# Patient Record
Sex: Male | Born: 1955 | Race: White | Hispanic: No | Smoking: Current every day smoker
Health system: Southern US, Community
[De-identification: ages and names within clinical notes are randomized; demographics above are authoritative.]

## PROBLEM LIST (undated history)

## (undated) DIAGNOSIS — F419 Anxiety disorder, unspecified: Secondary | ICD-10-CM

---

## 2004-01-02 ENCOUNTER — Emergency Department (HOSPITAL_COMMUNITY): Admission: EM | Admit: 2004-01-02 | Discharge: 2004-01-02 | Payer: Self-pay | Admitting: Emergency Medicine

## 2004-01-18 ENCOUNTER — Emergency Department (HOSPITAL_COMMUNITY): Admission: EM | Admit: 2004-01-18 | Discharge: 2004-01-18 | Payer: Self-pay | Admitting: *Deleted

## 2005-12-19 IMAGING — CR DG ANKLE COMPLETE 3+V*L*
3 series · 3 of 3 positions shown · non-contrast
Comparison: none

CLINICAL DATA: Twisted ankle ? lateral and dorsal pain.
 LEFT ANKLE, THREE VIEWS
 Three-view exam shows marked soft tissue swelling over the lateral malleolus.  There is a probable small avulsion off of the medial aspect of the distal fibula.  There is an accessory ossicle of the medial malleolus and a prominent calcaneal spur.
 IMPRESSION 
 Lateral soft tissue swelling with probable small avulsion off the fibula.

[view not recorded (1 of 3)]
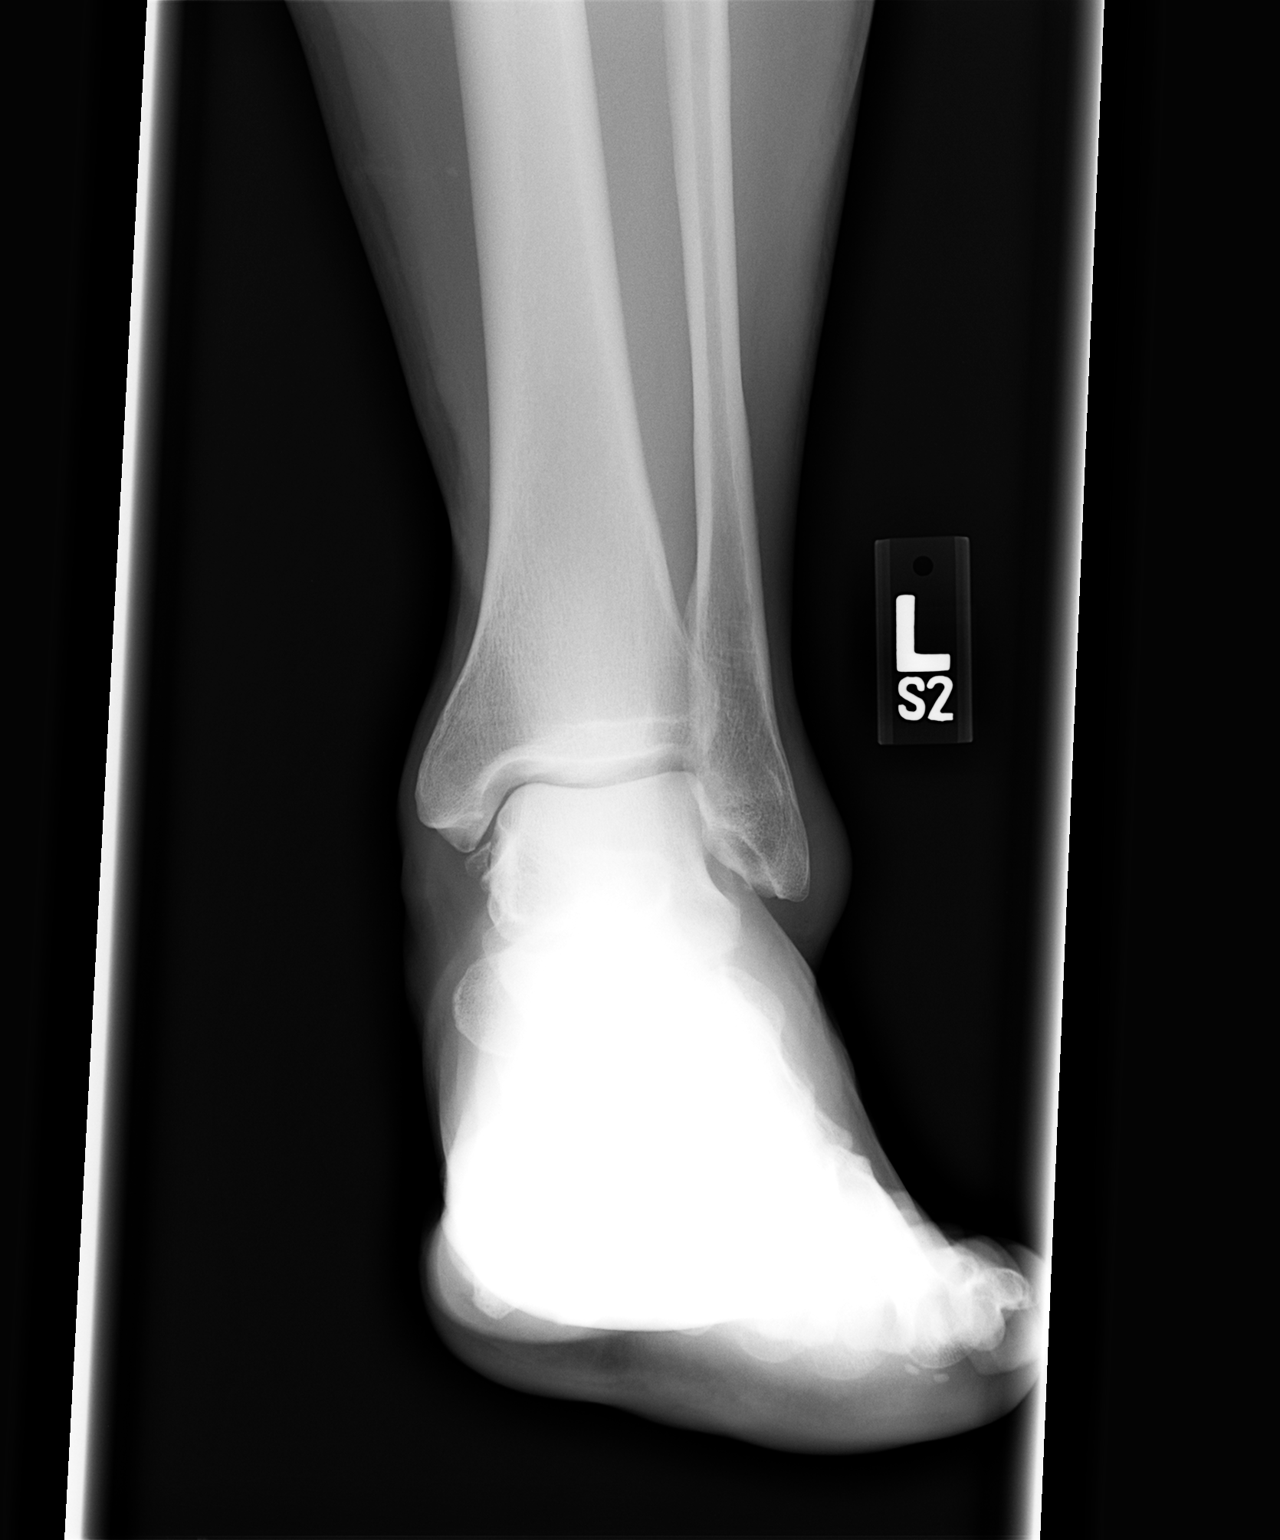

[view not recorded (2 of 3)]
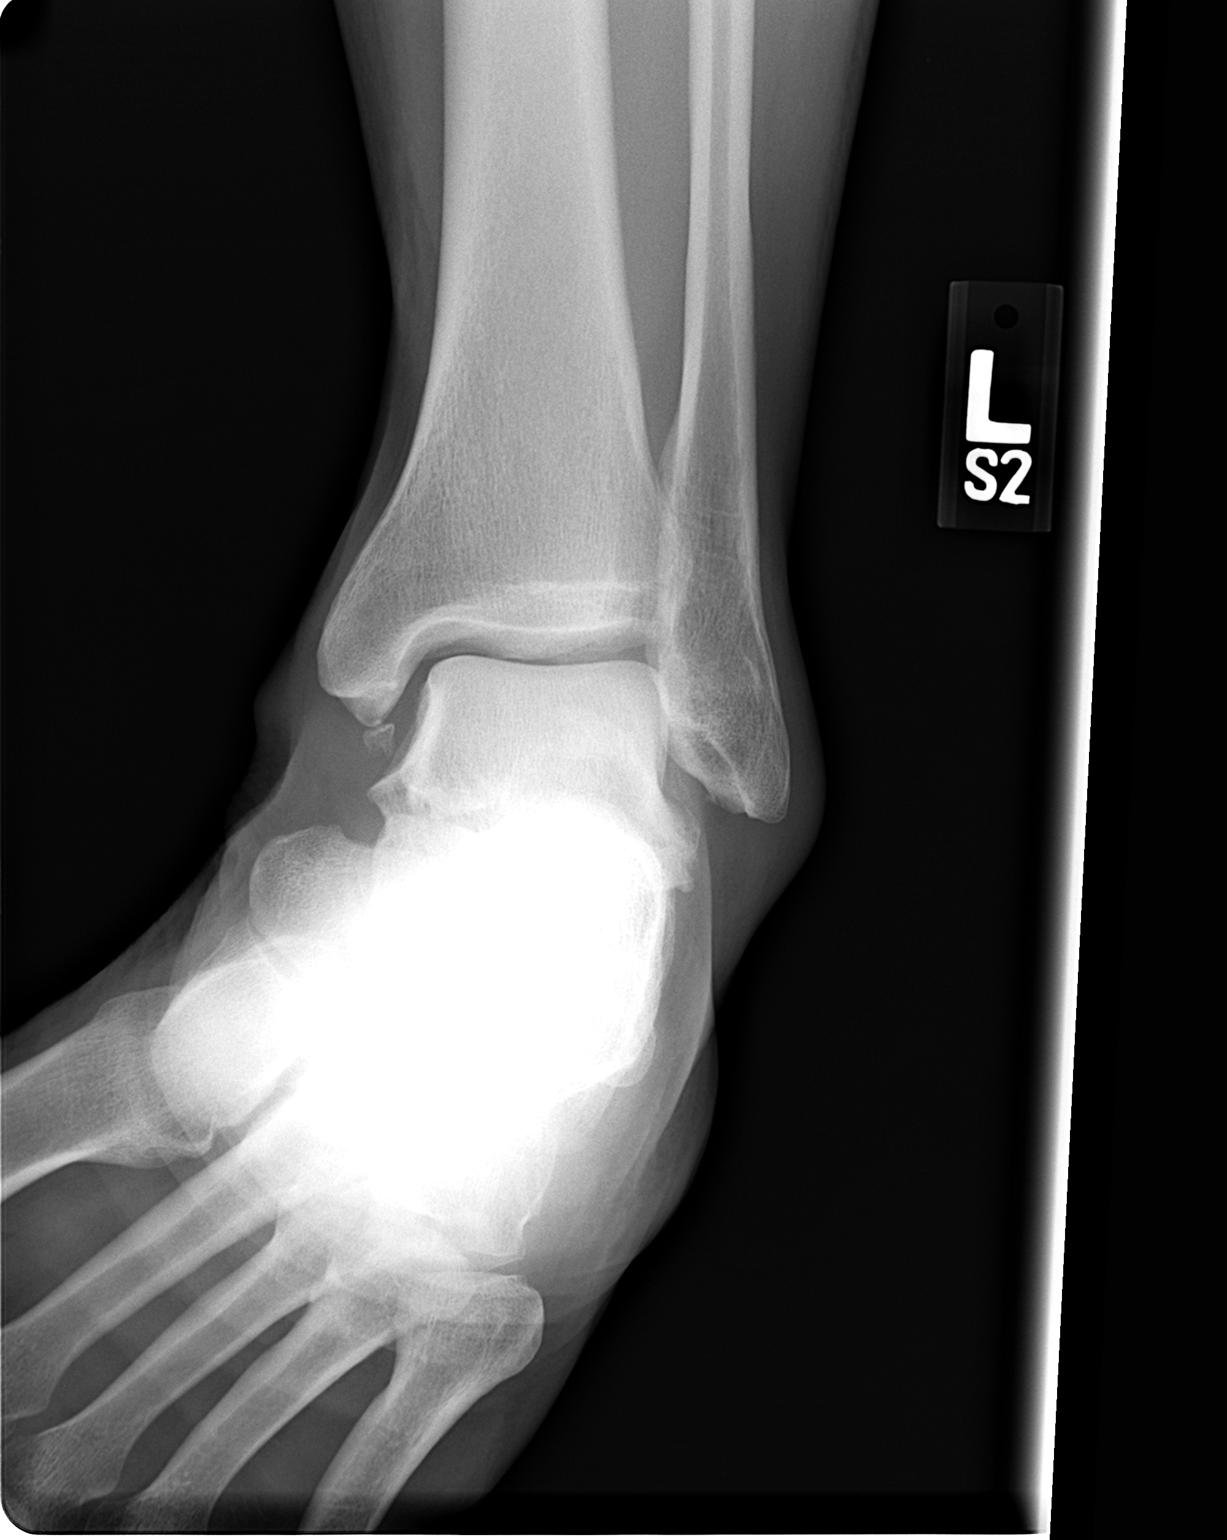

[view not recorded (3 of 3)]
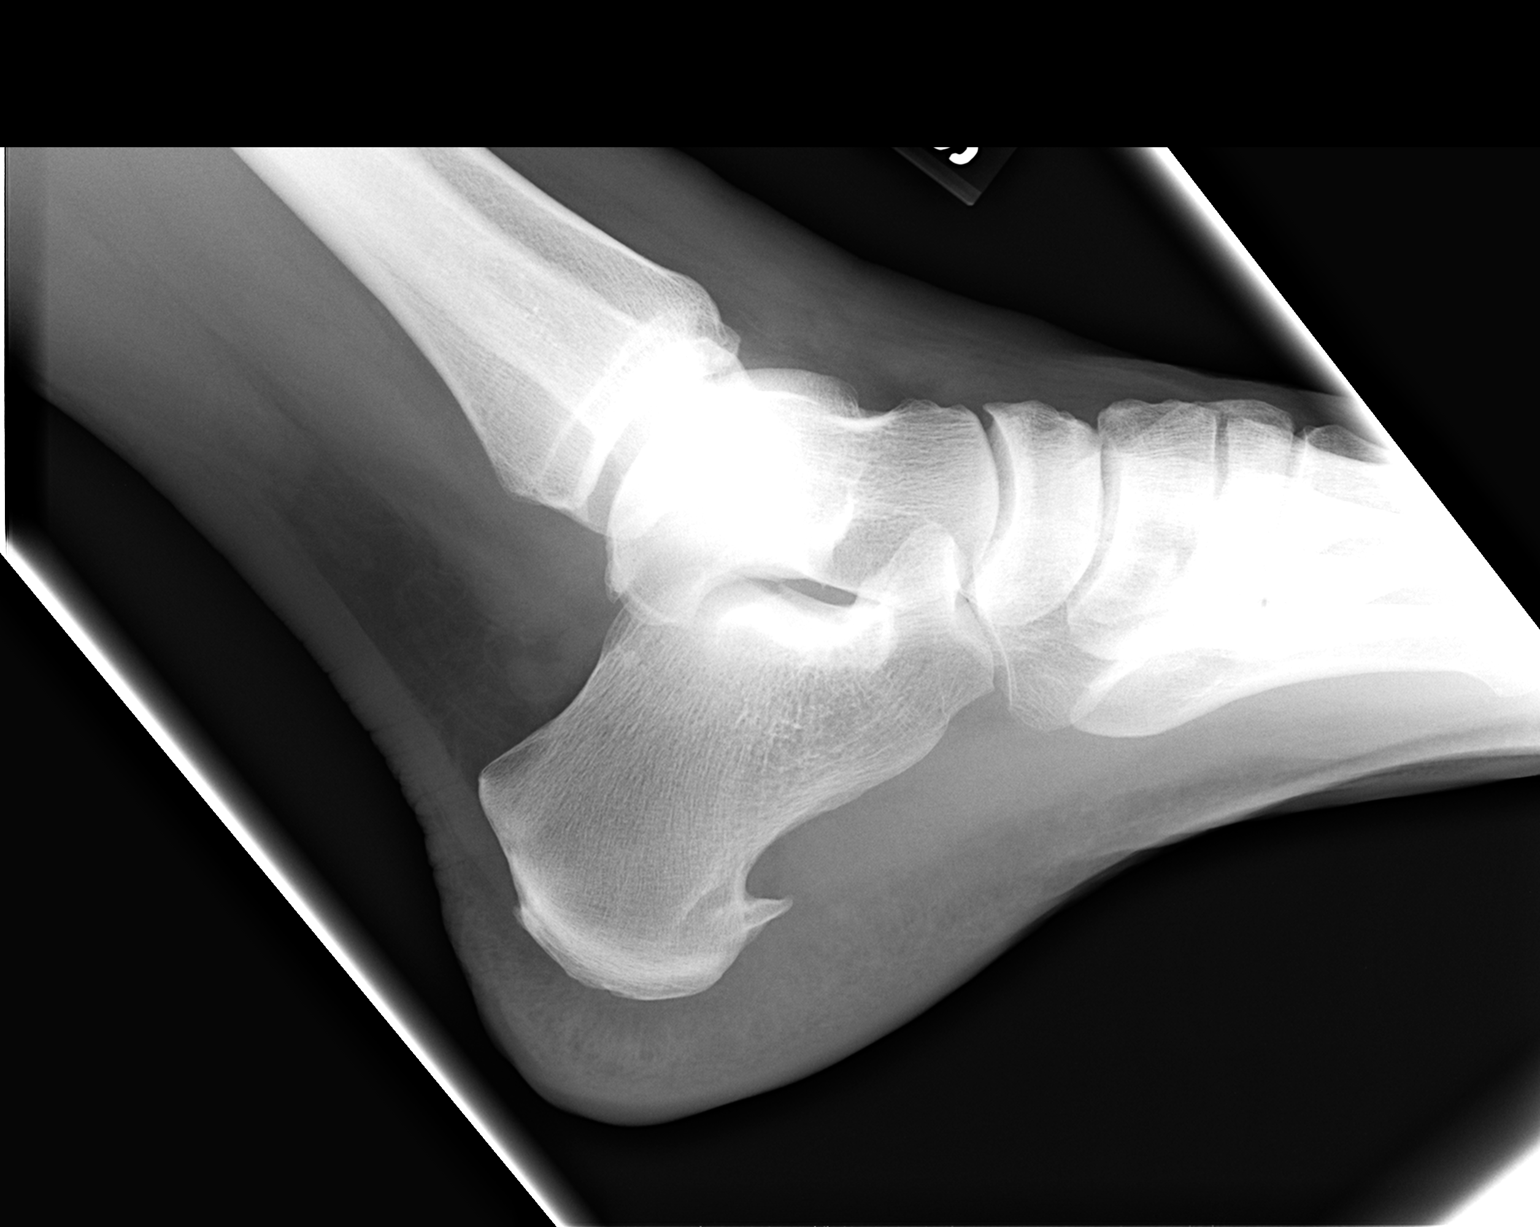

[3 of 3 positions shown; findings below may reference images not displayed]

## 2014-02-04 ENCOUNTER — Encounter (HOSPITAL_COMMUNITY): Payer: Self-pay | Admitting: *Deleted

## 2014-02-04 ENCOUNTER — Emergency Department (HOSPITAL_COMMUNITY)
Admission: EM | Admit: 2014-02-04 | Discharge: 2014-02-04 | Disposition: A | Discharge status: Home / Self Care | Payer: Commercial Managed Care - PPO | Attending: Emergency Medicine | Admitting: Emergency Medicine

## 2014-02-04 DIAGNOSIS — Z720 Tobacco use: Secondary | ICD-10-CM | POA: Diagnosis not present

## 2014-02-04 DIAGNOSIS — Z791 Long term (current) use of non-steroidal anti-inflammatories (NSAID): Secondary | ICD-10-CM | POA: Insufficient documentation

## 2014-02-04 DIAGNOSIS — Z79899 Other long term (current) drug therapy: Secondary | ICD-10-CM | POA: Insufficient documentation

## 2014-02-04 DIAGNOSIS — F419 Anxiety disorder, unspecified: Secondary | ICD-10-CM | POA: Diagnosis not present

## 2014-02-04 HISTORY — DX: Anxiety disorder, unspecified: F41.9

## 2014-02-04 LAB — COMPREHENSIVE METABOLIC PANEL
ALT: 32 U/L (ref 0–53)
AST: 86 U/L — ABNORMAL HIGH (ref 0–37)
Albumin: 4.3 g/dL (ref 3.5–5.2)
Alkaline Phosphatase: 61 U/L (ref 39–117)
Anion gap: 13 (ref 5–15)
BUN: 13 mg/dL (ref 6–23)
CO2: 24 mEq/L (ref 19–32)
Calcium: 9.7 mg/dL (ref 8.4–10.5)
Chloride: 104 mEq/L (ref 96–112)
Creatinine, Ser: 0.88 mg/dL (ref 0.50–1.35)
GFR calc Af Amer: >90 mL/min (ref >90)
GFR calc non Af Amer: >90 mL/min (ref >90)
Glucose, Bld: 151 mg/dL — ABNORMAL HIGH (ref 70–99)
Potassium: 4.4 mEq/L (ref 3.7–5.3)
Sodium: 141 mEq/L (ref 137–147)
Total Bilirubin: <0.2 mg/dL — ABNORMAL LOW (ref 0.3–1.2)
Total Protein: 7.6 g/dL (ref 6.0–8.3)

## 2014-02-04 LAB — RAPID URINE DRUG SCREEN, HOSP PERFORMED
Amphetamines: NOT DETECTED
Barbiturates: NOT DETECTED
Benzodiazepines: NOT DETECTED
Cocaine: NOT DETECTED
Opiates: NOT DETECTED
Tetrahydrocannabinol: NOT DETECTED

## 2014-02-04 LAB — SALICYLATE LEVEL: Salicylate Lvl: <2 mg/dL — ABNORMAL LOW (ref 2.8–20.0)

## 2014-02-04 LAB — CBC
HCT: 44 % (ref 39.0–52.0)
Hemoglobin: 15.5 g/dL (ref 13.0–17.0)
MCH: 32.4 pg (ref 26.0–34.0)
MCHC: 35.2 g/dL (ref 30.0–36.0)
MCV: 92.1 fL (ref 78.0–100.0)
Platelets: 224 10*3/uL (ref 150–400)
RBC: 4.78 MIL/uL (ref 4.22–5.81)
RDW: 12.6 % (ref 11.5–15.5)
WBC: 9.7 10*3/uL (ref 4.0–10.5)

## 2014-02-04 LAB — ACETAMINOPHEN LEVEL: Acetaminophen (Tylenol), Serum: <15 ug/mL (ref 10–30)

## 2014-02-04 LAB — ETHANOL: Alcohol, Ethyl (B): 36 mg/dL — ABNORMAL HIGH (ref 0–11)

## 2014-02-04 MED ORDER — LORAZEPAM 0.5 MG PO TABS
0.5000 mg | ORAL_TABLET | Freq: Once | ORAL | Status: AC
  Administered 2014-02-04: 0.5 mg via ORAL
  Filled 2014-02-04: qty 1

## 2014-02-04 MED ORDER — LORAZEPAM 0.5 MG PO TABS: 0.5000 mg | ORAL_TABLET | Freq: Three times a day (TID) | ORAL | Status: AC
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
## 2014-02-04 NOTE — ED Notes (Signed)
Pt reports recent increase in anxiety and PTSD, states he has been having more frequent flashbacks. Pt states he was on medications for anxiety years ago but says "they were basically turning me into a junkie, so I quit and I got better, but recently I've just been under a lot of stress. Usually I drink 3 or 4 beers and that helps, but today it didn't help at all."

## 2014-02-04 NOTE — ED Notes (Signed)
Bed: [facility]PT2 Expected date:  Expected time:  Means of arrival:  Comments: anxiety
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
## 2014-02-04 NOTE — Discharge Instructions (Signed)
Panic Attacks °Panic attacks are sudden, short-lived surges of severe anxiety, fear, or discomfort. They may occur for no reason when you are relaxed, when you are anxious, or when you are sleeping. Panic attacks may occur for a number of reasons:  °· Healthy people occasionally have panic attacks in extreme, life-threatening situations, such as war or natural disasters. Normal anxiety is a protective mechanism of the body that helps us react to danger (fight or flight response). °· Panic attacks are often seen with anxiety disorders, such as panic disorder, social anxiety disorder, generalized anxiety disorder, and phobias. Anxiety disorders cause excessive or uncontrollable anxiety. They may interfere with your relationships or other life activities. °· Panic attacks are sometimes seen with other mental illnesses, such as depression and posttraumatic stress disorder. °· Certain medical conditions, prescription medicines, and drugs of abuse can cause panic attacks. °SYMPTOMS  °Panic attacks start suddenly, peak within 20 minutes, and are accompanied by four or more of the following symptoms: °· Pounding heart or fast heart rate (palpitations). °· Sweating. °· Trembling or shaking. °· Shortness of breath or feeling smothered. °· Feeling choked. °· Chest pain or discomfort. °· Nausea or strange feeling in your stomach. °· Dizziness, light-headedness, or feeling like you will faint. °· Chills or hot flushes. °· Numbness or tingling in your lips or hands and feet. °· Feeling that things are not real or feeling that you are not yourself. °· Fear of losing control or going crazy. °· Fear of dying. °Some of these symptoms can mimic serious medical conditions. For example, you may think you are having a heart attack. Although panic attacks can be very scary, they are not life threatening. °DIAGNOSIS  °Panic attacks are diagnosed through an assessment by your health care provider. Your health care provider will ask  questions about your symptoms, such as where and when they occurred. Your health care provider will also ask about your medical history and use of alcohol and drugs, including prescription medicines. Your health care provider may order blood tests or other studies to rule out a serious medical condition. Your health care provider may refer you to a mental health professional for further evaluation. °TREATMENT  °· Most healthy people who have one or two panic attacks in an extreme, life-threatening situation will not require treatment. °· The treatment for panic attacks associated with anxiety disorders or other mental illness typically involves counseling with a mental health professional, medicine, or a combination of both. Your health care provider will help determine what treatment is best for you. °· Panic attacks due to physical illness usually go away with treatment of the illness. If prescription medicine is causing panic attacks, talk with your health care provider about stopping the medicine, decreasing the dose, or substituting another medicine. °· Panic attacks due to alcohol or drug abuse go away with abstinence. Some adults need professional help in order to stop drinking or using drugs. °HOME CARE INSTRUCTIONS  °· Take all medicines as directed by your health care provider.   °· Schedule and attend follow-up visits as directed by your health care provider. It is important to keep all your appointments. °SEEK MEDICAL CARE IF: °· You are not able to take your medicines as prescribed. °· Your symptoms do not improve or get worse. °SEEK IMMEDIATE MEDICAL CARE IF:  °· You experience panic attack symptoms that are different than your usual symptoms. °· You have serious thoughts about hurting yourself or others. °· You are taking medicine for panic attacks and   have a serious side effect. °MAKE SURE YOU: °· Understand these instructions. °· Will watch your condition. °· Will get help right away if you are not  doing well or get worse. °Document Released: 03/06/2005 Document Revised: 03/11/2013 Document Reviewed: 10/18/2012 °ExitCare® Patient Information ©2015 [employer]. This information is not intended to replace advice given to you by your health care provider. Make sure you discuss any questions you have with your health care provider. ° °Emergency Department Resource Guide °1) Find a Doctor and Pay Out of Pocket °Although you won't have to find out who is covered by your insurance plan, it is a good idea to ask around and get recommendations. You will then need to call the office and see if the doctor you have chosen will accept you as a new patient and what types of options they offer for patients who are self-pay. Some doctors offer discounts or will set up payment plans for their patients who do not have insurance, but you will need to ask so you aren't surprised when you get to your appointment. ° °2) Contact Your Local Health Department °Not all health departments have doctors that can see patients for sick visits, but many do, so it is worth a call to see if yours does. If you don't know where your local health department is, you can check in your phone book. The CDC also has a tool to help you locate your state's health department, and many state websites also have listings of all of their local health departments. ° °3) Find a Walk-in Clinic °If your illness is not likely to be very severe or complicated, you may want to try a walk in clinic. These are popping up all over the country in pharmacies, drugstores, and shopping centers. They're usually staffed by nurse practitioners or physician assistants that have been trained to treat common illnesses and complaints. They're usually fairly quick and inexpensive. However, if you have serious medical issues or chronic medical problems, these are probably not your best option. ° °No Primary Care Doctor: °- Call [facility] at  832-8000 - they can help you  locate a primary care doctor that  accepts your insurance, provides certain services, etc. °- Physician Referral Service- 1-800-533-3463 ° °Chronic Pain Problems: °Organization         Address  Phone   Notes  °[facility]  (336) 297-2271 Patients need to be referred by their primary care doctor.  ° °Medication Assistance: °Organization         Address  Phone   Notes  °[place] [facility] [address] °[place], [place] [address] (336) 641-8030 --Must be a resident of [place] °-- Must have NO insurance coverage whatsoever (no Medicaid/ Medicare, etc.) °-- The pt. MUST have a primary care doctor that directs their care regularly and follows them in the community °  °[facility]  (866) 331-1348   °[employer]  (888) 892-1162   ° °Agencies that provide inexpensive medical care: °Organization         Address  Phone   Notes  °[facility]  (336) 832-8035   °[facility]    (336) 832-7272   °[facility] [address] °[place], [place] [address] (336) 832-4777   °[facility] [address], °[place] (336) 271-4999   °[facility]    (336) 373-0678   °[facility]    (336) 272-1050   °  [facility] ° [address], [place] Phone:  (336) 832-4444, Fax:  (336) 832-4440 Hours of Operation:  9 am - 6 pm, M-F.  Also accepts Medicaid/Medicare and self-pay.  °[facility] ° [address], [place] Phone: (336) [phone], Fax: (336) [phone]. Hours of Operation:  8:30 am - 5:30 pm, M-F.  Also accepts Medicaid and self-pay.  °[facility] [place] [address], [place] Phone: (336) 878-6027   °[facility] [address], [place], [place] (336)723-1848, Ext. 123 Mondays & Thursdays: 7-9 AM.  First 15 patients are seen on a first come, first serve basis. °  ° °Medicaid-accepting [place]  Providers: ° °Organization         Address  Phone   Notes  °[facility] [address], [place] (336) 641-2100 Also accepts self-pay patients.  °[facility] [address], [place] ° (336) 856-9996   °[facility] [address], [place] (336) 288-8857   °[facility] [address] [place] [address], [place] (336) 299-7000   °Veita Bland [address], [place]  ° (336) 373-1557 Only accepts [place] Access Medicaid patients after they have their name applied to their card.  ° °Self-Pay (no insurance) in [place]: ° °Organization         Address  Phone   Notes  °Sickle Cell Patients, [facility] [address], [place] (336) 832-1970   °[facility] [address], [place] (336) 832-4400   °[facility] ° [address],  (336) 992-4800   °[facility]/Dr. Osei-Bonsu ° [address] [place] [address], [place] or [address], [place] (336) 841-8500 Phone number for both [place] and [place] locations is the same.  °[facility] [address], [place] (336) 299-0000   °[facility] [address] [place] [address], [place] or [address] (336) 852-7530 °(336) 878-2260   °[facility] [address], [place] (336) 350-1642, phone; (336) 294-5005, fax Sees patients 1st and 3rd Saturday of every month.  Must not qualify for public or private insurance (i.e. Medicaid, Medicare, [place] Health Choice, Veterans' Benefits) • Household income should be no more than 200% of the poverty level •The clinic cannot treat you if you are pregnant or think you are pregnant • Sexually transmitted diseases are not treated at the clinic.  ° ° °Dental Care: °Organization         Address  Phone  Notes  °[facility] [address], [place] (336) 641-6152 Accepts children up to age 21 who are enrolled in Medicaid or [place] Health Choice; pregnant women with a Medicaid card; and children who have applied for Medicaid or [place] Health Choice, but were declined, whose parents can pay a reduced fee at time of service.  °[facility] [place]  [address], [place] (336) 641-7733 Accepts children up to age 21 who are enrolled in Medicaid or [place] Health Choice; pregnant women with a Medicaid card; and children who have applied for Medicaid or [place] Health Choice, but were declined, whose parents can pay a reduced fee at time of service.  °[facility] ° [address], [place] (336) 641-4533 Patients are seen by appointment only. Walk-ins are not accepted. [facility] will see patients 18 years of age and older. °Monday - Tuesday (8am-5pm) °Most Wednesdays (8:30-5pm) °$30 per visit, cash only  °[facility] ° [address], [place] (336) 641-4533 Patients are seen by appointment only. Walk-ins are not accepted. [facility] will see patients 18 years of age and older. °One Wednesday Evening (Monthly: Volunteer Based).  $30 per visit, cash only  °[facility]  (919) 537-3737 for adults; Children under age 4, call Graduate Pediatric Dentistry at ([phone]) [phone]. Children aged 4-14, please call ([phone]) [phone] to request a pediatric application. ° Dental services are provided in all areas of dental care including fillings, crowns and bridges, complete and partial dentures, implants, gum treatment, root canals, and extractions. Preventive care is also provided. Treatment is provided to both adults and children. °Patients are selected via a lottery and there is often a waiting list. °  °[facility] [address], °[place] ° (336) 763-8833 [email or web address] °  °[facility]  [address], [place], [place] (336)723-1848, Ext. 123 Second and Fourth Thursday of each month, opens at 6:30 AM; Clinic ends at 9 AM.  Patients are seen on a first-come first-served basis, and a limited number are seen during each clinic.  ° °[facility] ° [address], [place], [place] (336) 723-7904   Eligibility Requirements °You must have lived in [place], [place], or [place] counties for at least the last three months. °  You cannot be eligible for state or federal sponsored healthcare insurance, including Veterans Administration, Medicaid, or Medicare. °  You generally cannot be eligible for healthcare insurance through your employer.  °  How to apply: °Eligibility screenings are held every Tuesday and Wednesday afternoon from 1:00 pm until 4:00 pm. You do not need an appointment for the interview!  °[facility] [address], [place], [place] 336-631-2330   °[facility]  336-342-8273   °[facility]  336-703-3100   °[facility]  336-570-6415   ° °Behavioral Health Resources in the Community: °Intensive Outpatient Programs °Organization         Address  Phone  Notes  °[facility] [address], [place], [place] 336-878-6098   °[facility] [address], [place], [place] 336-832-9800   °[facility] [address], [place], [place] ° 336-882-2125   °[facility] [address],  °[place], [place] 1-800-853-5163 or 336-641-4981   °Substance Abuse Resources °Organization         Address  Phone  Notes  °[facility]  336-882-2125   °[facility]  336-784-9470   °[facility]  336-285-9073   °[facility]  336-845-3988   °Residential & Outpatient Substance Abuse Program  1-800-659-3381   °Psychological Services °Organization         Address  Phone  Notes  °[facility]  336- 832-9600     °[facility]  336- 378-7881   °[facility] [address], [place] 1-800-853-5163 or 336-641-4981   ° °Mobile Crisis Teams °Organization         Address  Phone  Notes  °[facility], Mobile Crisis Care Unit  1-877-626-1772   °Assertive °[facility] ° [address] [place],  336-834-9664   °  Sharon DeEsch [address] °[place] 336-554-5454   ° °Self-Help/Support Groups °Organization         Address  Phone             Notes  °[facility] - variety of support groups  336- 373-1402 Call for more information  °Narcotics Anonymous (NA), [facility] [address], °[place]  2 meetings at this location  ° °Residential Treatment Programs °Organization         Address  Phone  Notes  °[facility] [address],    °[place]  1-866-801-8205   °[facility] ° [address], [place], [place] 704-293-8524   °[facility] [address], [place] 336-845-3988 Admissions: 8am-3pm M-F  °[facility] [address]    °[place], [place] 336-841-1104   °[facility] [address], [place], [place] 336-379-7146   °[facility] [address]  °[place], [place] 336-285-9073   °[facility] - Intensive Outpatient [address], [place], [place] 336-852-3033   °[facility] [address]  °[place], [place] 1-877-615-2722 or 336-784-9470   °[facility] [address] [place], [place] 336-227-7417 Accepts Medicaid  °[facility] [address]  °[place] 1-800-659-3381 Substance Abuse/Addiction Treatment  ° °[place] Behavioral Health Resources °Organization         Address  Phone  Notes  °[facility]  (888) 581-9988   °Julie Brannon, PhD [address] [place], [place]   (336) 349-5553 or (336) 951-0000    °[facility]   [address] °[place], [place] (336) 349-4454   °[facility] [address], [place], [place] (336) 342-8316 Insurance/Medicaid/sponsorship through [facility]  °[facility] [address]                                    [place], [place] (336) 342-8316 Therapy/tele-psych/case  °[facility] [address]  ° [place], [place] (336) 349-2233    °Dr. Arfeen  (336) 349-4544   °[facility] [place]  [employer] [place] [facility] 1) [address], [place] °2) [address], [place] °3)  [address], [place] (336) 349-3220 °(336) 342-7768 ° °(336) 342-8140   °[place] [facility] (336) 342-1394 or (336) 342-3537 (After Hours)    ° ° ° °
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
